# Patient Record
Sex: Female | Born: 1950 | Race: Black or African American | Hispanic: No | Marital: Married | Smoking: Never smoker
Health system: Southern US, Community
[De-identification: ages and names within clinical notes are randomized; demographics above are authoritative.]

## PROBLEM LIST (undated history)

## (undated) DIAGNOSIS — I10 Essential (primary) hypertension: Secondary | ICD-10-CM

## (undated) DIAGNOSIS — E119 Type 2 diabetes mellitus without complications: Secondary | ICD-10-CM

## (undated) DIAGNOSIS — E7800 Pure hypercholesterolemia, unspecified: Secondary | ICD-10-CM

## (undated) HISTORY — PX: ABDOMINAL HYSTERECTOMY: SHX81

## (undated) HISTORY — PX: BREAST SURGERY: SHX581

---

## 2016-10-24 ENCOUNTER — Emergency Department (HOSPITAL_BASED_OUTPATIENT_CLINIC_OR_DEPARTMENT_OTHER): Payer: PRIVATE HEALTH INSURANCE

## 2016-10-24 ENCOUNTER — Emergency Department (HOSPITAL_BASED_OUTPATIENT_CLINIC_OR_DEPARTMENT_OTHER)
Admission: EM | Admit: 2016-10-24 | Discharge: 2016-10-24 | Disposition: A | Discharge status: Home / Self Care | Payer: PRIVATE HEALTH INSURANCE | Attending: Emergency Medicine | Admitting: Emergency Medicine

## 2016-10-24 ENCOUNTER — Encounter (HOSPITAL_BASED_OUTPATIENT_CLINIC_OR_DEPARTMENT_OTHER): Payer: Self-pay | Admitting: *Deleted

## 2016-10-24 DIAGNOSIS — I10 Essential (primary) hypertension: Secondary | ICD-10-CM | POA: Diagnosis not present

## 2016-10-24 DIAGNOSIS — M549 Dorsalgia, unspecified: Secondary | ICD-10-CM | POA: Diagnosis present

## 2016-10-24 DIAGNOSIS — R0781 Pleurodynia: Secondary | ICD-10-CM

## 2016-10-24 DIAGNOSIS — R109 Unspecified abdominal pain: Secondary | ICD-10-CM | POA: Diagnosis not present

## 2016-10-24 DIAGNOSIS — E119 Type 2 diabetes mellitus without complications: Secondary | ICD-10-CM | POA: Diagnosis not present

## 2016-10-24 DIAGNOSIS — N390 Urinary tract infection, site not specified: Secondary | ICD-10-CM | POA: Diagnosis not present

## 2016-10-24 DIAGNOSIS — R0789 Other chest pain: Secondary | ICD-10-CM | POA: Diagnosis not present

## 2016-10-24 DIAGNOSIS — Z79899 Other long term (current) drug therapy: Secondary | ICD-10-CM | POA: Diagnosis not present

## 2016-10-24 DIAGNOSIS — Z7984 Long term (current) use of oral hypoglycemic drugs: Secondary | ICD-10-CM | POA: Insufficient documentation

## 2016-10-24 HISTORY — DX: Essential (primary) hypertension: I10

## 2016-10-24 HISTORY — DX: Type 2 diabetes mellitus without complications: E11.9

## 2016-10-24 HISTORY — DX: Pure hypercholesterolemia, unspecified: E78.00

## 2016-10-24 LAB — CBC WITH DIFFERENTIAL/PLATELET
BASOS ABS: 0 10*3/uL (ref 0.0–0.1)
BASOS PCT: 0 %
Eosinophils Absolute: 0.1 10*3/uL (ref 0.0–0.7)
Eosinophils Relative: 1 %
HEMATOCRIT: 39.9 % (ref 36.0–46.0)
Hemoglobin: 14 g/dL (ref 12.0–15.0)
Lymphocytes Relative: 11 %
Lymphs Abs: 0.8 10*3/uL (ref 0.7–4.0)
MCH: 32.4 pg (ref 26.0–34.0)
MCHC: 35.1 g/dL (ref 30.0–36.0)
MCV: 92.4 fL (ref 78.0–100.0)
MONO ABS: 1.2 10*3/uL — AB (ref 0.1–1.0)
Monocytes Relative: 16 %
NEUTROS ABS: 5.7 10*3/uL (ref 1.7–7.7)
NEUTROS PCT: 72 %
Platelets: 228 10*3/uL (ref 150–400)
RBC: 4.32 MIL/uL (ref 3.87–5.11)
RDW: 12.8 % (ref 11.5–15.5)
WBC: 7.9 10*3/uL (ref 4.0–10.5)

## 2016-10-24 LAB — URINALYSIS, ROUTINE W REFLEX MICROSCOPIC
Bilirubin Urine: NEGATIVE
GLUCOSE, UA: NEGATIVE mg/dL
Hgb urine dipstick: NEGATIVE
Ketones, ur: NEGATIVE mg/dL
Nitrite: NEGATIVE
PH: 6.5 (ref 5.0–8.0)
PROTEIN: NEGATIVE mg/dL
SPECIFIC GRAVITY, URINE: 1.015 (ref 1.005–1.030)

## 2016-10-24 LAB — COMPREHENSIVE METABOLIC PANEL
ALBUMIN: 4.1 g/dL (ref 3.5–5.0)
ALT: 17 U/L (ref 14–54)
AST: 19 U/L (ref 15–41)
Alkaline Phosphatase: 52 U/L (ref 38–126)
Anion gap: 12 (ref 5–15)
BILIRUBIN TOTAL: 1.9 mg/dL — AB (ref 0.3–1.2)
BUN: 10 mg/dL (ref 6–20)
CHLORIDE: 102 mmol/L (ref 101–111)
CO2: 26 mmol/L (ref 22–32)
Calcium: 9 mg/dL (ref 8.9–10.3)
Creatinine, Ser: 0.75 mg/dL (ref 0.44–1.00)
GFR calc Af Amer: >60 mL/min (ref >60)
GFR calc non Af Amer: >60 mL/min (ref >60)
GLUCOSE: 140 mg/dL — AB (ref 65–99)
POTASSIUM: 2.9 mmol/L — AB (ref 3.5–5.1)
Sodium: 140 mmol/L (ref 135–145)
TOTAL PROTEIN: 7.1 g/dL (ref 6.5–8.1)

## 2016-10-24 LAB — URINALYSIS, MICROSCOPIC (REFLEX): RBC / HPF: NONE SEEN RBC/hpf (ref 0–5)

## 2016-10-24 MED ORDER — POTASSIUM CHLORIDE CRYS ER 20 MEQ PO TBCR
40.0000 meq | EXTENDED_RELEASE_TABLET | Freq: Once | ORAL | Status: DC
  Filled 2016-10-24: qty 2

## 2016-10-24 MED ORDER — ACETAMINOPHEN 325 MG PO TABS
650.0000 mg | ORAL_TABLET | Freq: Once | ORAL | Status: DC
  Filled 2016-10-24: qty 2

## 2016-10-24 MED ORDER — ACETAMINOPHEN 325 MG PO TABS: 650.0000 mg | ORAL_TABLET | Freq: Once | ORAL | Status: DC

## 2016-10-24 MED ORDER — ONDANSETRON HCL 4 MG/2ML IJ SOLN
4.0000 mg | Freq: Once | INTRAMUSCULAR | Status: AC
  Administered 2016-10-24: 4 mg via INTRAVENOUS
  Filled 2016-10-24: qty 2

## 2016-10-24 MED ORDER — KETOROLAC TROMETHAMINE 30 MG/ML IJ SOLN: 30.0000 mg | Freq: Once | INTRAMUSCULAR | Status: DC

## 2016-10-24 MED ORDER — SODIUM CHLORIDE 0.9 % IV BOLUS (SEPSIS)
1000.0000 mL | Freq: Once | INTRAVENOUS | Status: AC
  Administered 2016-10-24: 1000 mL via INTRAVENOUS

## 2016-10-24 MED ORDER — CIPROFLOXACIN HCL 500 MG PO TABS: 500.0000 mg | ORAL_TABLET | Freq: Two times a day (BID) | ORAL | 0 refills | Status: AC

## 2016-10-24 MED ORDER — MORPHINE SULFATE (PF) 4 MG/ML IV SOLN: 4.0000 mg | Freq: Once | INTRAVENOUS | Status: DC

## 2016-10-24 MED ORDER — KETOROLAC TROMETHAMINE 30 MG/ML IJ SOLN
15.0000 mg | Freq: Once | INTRAMUSCULAR | Status: AC
  Administered 2016-10-24: 15 mg via INTRAVENOUS
  Filled 2016-10-24: qty 1

## 2016-10-24 NOTE — ED Notes (Signed)
Pt up to bathroom. EMT will obtain vs when pt returns.

## 2016-10-24 NOTE — Discharge Instructions (Signed)
Please read instructions below. Take your antibiotic, Ciprofloxacin, as directed until it is gone. You can continue taking advil as needed for pain. Drink plenty of water. Schedule an appointment with your primary care provider to follow up in 1 week. Return to the ER if you develop a fever, have nausea, back pain, or new or concerning symptoms.

## 2016-10-24 NOTE — ED Notes (Signed)
Pt refused tylenol at this time. States she will wait for blood tests to come back.

## 2016-10-24 NOTE — ED Notes (Signed)
Pt up to bathroom with assistance 

## 2016-10-24 NOTE — ED Notes (Signed)
Pt states she does not want morphine for pain relief.  [place], PA-C advised.

## 2016-10-24 NOTE — ED Triage Notes (Signed)
Pt reports R mid-back since last night. Denies known injury, chest pain, sob. Reports being seen at [facility] this am and had x-ray done (states they didn't tell her the results).

## 2016-10-24 NOTE — ED Provider Notes (Signed)
[facility]   CSN: [id number] Arrival date & time: 10/24/16  1725   By signing my name below, I, Soijett Blue, attest that this documentation has been prepared under the direction and in the presence of [place] Russo, PA-C Electronically Signed: Soijett Blue, ED Scribe. 10/24/16. 6:54 PM.  History   Chief Complaint Chief Complaint  Patient presents with  . Back Pain    HPI Melanie May is a 66 y.o. female with a PMHx of DM, HTN, who presents to the Emergency Department complaining of constant, stabbing, right mid-back/flank pain onset last night. Pt reports associated decreased PO intake, subjective fever, and chills. Pt has tried Advil with moderate relief of her symptoms. Pt right sided mid-back pain is worsened with laying down and bending forward. She states that she was evaluated at Urgent Care this morning and had an xray completed of her back with unknown results. She notes that she was given an injection while in the office and prescribed a muscle relaxer, which was reportedly never filled at the pharmacy. Pt reports that when she went to pick up her prescription, it wasn't there and she was informed to come into the ED for further evaluation. Pt denies abdominal pain, changes in bowel movements, rash, SOB, CP, bowel/bladder incontinence, difficulty urinating, dysuria, urinary frequency, recent rib trauma, and any other symptoms. Denies hx of CA or IV drug use. 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 Denies smoking cigarettes or consuming ETOH.    The history is provided by the patient. No language interpreter was used.    Past Medical History:  Diagnosis Date  . Diabetes mellitus without complication (HCC)   . Hypercholesteremia   . Hypertension     There are no active problems to display for this patient.   Past Surgical History:  Procedure Laterality Date  .  ABDOMINAL HYSTERECTOMY    . BREAST SURGERY      OB History    No data available       Home Medications    Prior to Admission medications   Medication Sig Start Date End Date Taking? Authorizing Provider  amLODipine (NORVASC) 5 MG tablet Take 5 mg by mouth daily.   Yes [provider]  atorvastatin (LIPITOR) 20 MG tablet Take 20 mg by mouth daily.   Yes [provider]  Cholecalciferol (VITAMIN D3) 1000 units CAPS Take by mouth.   Yes [provider]  hydrochlorothiazide (HYDRODIURIL) 25 MG tablet Take 25 mg by mouth daily.   Yes [provider]  Magnesium 250 MG TABS Take 1 tablet by mouth daily.   Yes [provider]  metFORMIN (GLUCOPHAGE) 500 MG tablet Take by mouth 2 (two) times daily with a meal.   Yes [provider]  potassium chloride SA (K-DUR,KLOR-CON) 20 MEQ tablet Take 20 mEq by mouth daily.   Yes [provider]  ciprofloxacin (CIPRO) 500 MG tablet Take 1 tablet (500 mg total) by mouth 2 (two) times daily. 10/24/16 10/31/16  Russo, [place] N, PA-C    Family History No family history on file.  Social History Social History  Substance Use Topics  . Smoking status: Never Smoker  . Smokeless tobacco: Never Used  . Alcohol use No     Allergies   Contrast media [iodinated diagnostic agents]; Iodine; and Sulfa antibiotics   Review of Systems Review of Systems  Constitutional: Positive for  appetite change, chills and fever (subjective).  HENT: Negative for trouble swallowing.   Eyes: Negative for visual disturbance.  Respiratory: Negative for shortness of breath.   Cardiovascular: Negative for chest pain.  Gastrointestinal: Negative for abdominal pain, constipation, diarrhea, nausea and vomiting.       No bowel incontinence.   Endocrine: Positive for polyuria (Chronic secondary to T2DM).  Genitourinary: Negative for difficulty urinating, dysuria and frequency.       No bladder incontinence.     Musculoskeletal: Positive for back pain (right mid).  Skin: Negative for rash.  Allergic/Immunologic: Positive for immunocompromised state (Type 2 DM).  Neurological: Negative for headaches.    Physical Exam Updated Vital Signs BP 132/68 (BP Location: Left Arm)   Pulse 80   Temp 98.9 F (37.2 C) (Oral)   Resp 16   Ht 5' 7.5" (1.715 m)   Wt 208 lb (94.3 kg)   SpO2 96%   BMI 32.10 kg/m   Physical Exam  Constitutional: She appears well-developed and well-nourished.  Patient appears uncomfortable, pacing in the room.  HENT:  Head: Normocephalic and atraumatic.  Mouth/Throat: Oropharynx is clear and moist.  Eyes: Conjunctivae are normal.  Neck: Normal range of motion.  Cardiovascular: Normal rate, regular rhythm, normal heart sounds and intact distal pulses.  Exam reveals no gallop and no friction rub.   No murmur heard. Pulmonary/Chest: Effort normal and breath sounds normal. No respiratory distress. She has no wheezes. She has no rales.  Abdominal: Soft. Normal appearance and bowel sounds are normal. She exhibits no distension and no mass. There is no tenderness. There is CVA tenderness (Right). There is no rigidity, no rebound and negative Murphy's sign. No hernia.  Musculoskeletal:  Right posterior and lateral rib tenderness. No spinal or paraspinal tenderness. Nl ROM to bilateral shoulders.  Neurological: She is alert.  Skin: Skin is warm.  Skin without evidence of a rash or hypersensitivity overlying area of pain.  Psychiatric: She has a normal mood and affect. Her behavior is normal.  Nursing note and vitals reviewed.    ED Treatments / Results  DIAGNOSTIC STUDIES: Oxygen Saturation is 96% on RA, nl by my interpretation.    COORDINATION OF CARE: 6:51 PM Discussed treatment plan with pt at bedside and pt agreed to plan.   Labs (all labs ordered are listed, but only abnormal results are displayed) Labs Reviewed  URINALYSIS, ROUTINE W REFLEX MICROSCOPIC -  Abnormal; Notable for the following:       Result Value   Leukocytes, UA LARGE (*)    All other components within normal limits  URINALYSIS, MICROSCOPIC (REFLEX) - Abnormal; Notable for the following:    Bacteria, UA MANY (*)    Squamous Epithelial / LPF 0-5 (*)    All other components within normal limits  CBC WITH DIFFERENTIAL/PLATELET - Abnormal; Notable for the following:    Monocytes Absolute 1.2 (*)    All other components within normal limits  COMPREHENSIVE METABOLIC PANEL - Abnormal; Notable for the following:    Potassium 2.9 (*)    Glucose, Bld 140 (*)    Total Bilirubin 1.9 (*)    All other components within normal limits  URINE CULTURE    EKG  EKG Interpretation None       Radiology Dg Chest 2 View  Result Date: 10/24/2016 CLINICAL DATA:  Right mid back pain EXAM: CHEST  2 VIEW COMPARISON:  None. FINDINGS: The heart size and mediastinal contours are within normal limits. Both lungs are clear.  Mild degenerative changes of the spine. IMPRESSION: No active cardiopulmonary disease. Electronically Signed   By: Jasmine Pang M.D.   On: 10/24/2016 20:59   Ct Renal Stone Study  Result Date: 10/24/2016 CLINICAL DATA:  Right flank pain for 2 days. EXAM: CT ABDOMEN AND PELVIS WITHOUT CONTRAST TECHNIQUE: Multidetector CT imaging of the abdomen and pelvis was performed following the standard protocol without IV contrast. COMPARISON:  None. FINDINGS: Lower chest: No acute abnormality. Hepatobiliary: Several hepatic cysts are identified. No suspicious masses. The gallbladder is normal in appearance. Pancreas: Unremarkable. No pancreatic ductal dilatation or surrounding inflammatory changes. Spleen: Normal in size without focal abnormality. Adrenals/Urinary Tract: Adrenal glands are unremarkable. Kidneys are normal, without renal calculi, focal lesion, or hydronephrosis. Bladder is unremarkable. Stomach/Bowel: The stomach and small bowel are normal. A few scattered colonic diverticuli  are identified in the right colon. No diverticulitis identified. The appendix is well seen with no appendicitis. Remainder of the colon is unremarkable. Vascular/Lymphatic: Atherosclerosis is seen in the non aneurysmal aorta. No adenopathy. Reproductive: Status post hysterectomy. No adnexal masses. Other: There is ascites in the pelvis of uncertain etiology. No free air. There is a fat containing umbilical hernia. Musculoskeletal: No acute or significant osseous findings. IMPRESSION: 1. There is a small amount of free fluid in the pelvis of uncertain etiology. No cause for acute pain identified. 2. Mild atherosclerosis. Electronically Signed   By: Gerome Sam III M.D   On: 10/24/2016 21:02    Procedures Procedures (including critical care time)  Medications Ordered in ED Medications  potassium chloride SA (K-DUR,KLOR-CON) CR tablet 40 mEq (40 mEq Oral Not Given 10/24/16 2224)  sodium chloride 0.9 % bolus 1,000 mL (0 mLs Intravenous Stopped 10/24/16 2248)  ketorolac (TORADOL) 30 MG/ML injection 15 mg (15 mg Intravenous Given 10/24/16 2114)  ondansetron (ZOFRAN) injection 4 mg (4 mg Intravenous Given 10/24/16 2147)     Initial Impression / Assessment and Plan / ED Course  I have reviewed the triage vital signs and the nursing notes.  Pertinent labs & imaging results that were available during my care of the patient were reviewed by me and considered in my medical decision making (see chart for details).     Patient with acute onset of right posterior rib pain, likely musculoskeletal. Tenderness reproducible on exam. UA done; consistent with UTI. Due to pt's level of discomfort, CT renal stone study done as well as chest x-ray to rule out nephrolithiasis versus cardiopulmonary etiology; Both resulting negative. Patient is hemodynamically stable, vital signs are normal. CBC and CMP unremarkable, with the exception of K 2.9. Patient states she takes by mouth potassium replacement daily, however has  not taken her dose today. Patient offered PO dose in ED, however patient declined. Fluid bolus given. Toradol with moderate relief of pain. Patient discussed with and seen by Dr. Madilyn Hook. Patient is safe for discharge with PCP follow-up. Cipro for UTI. Symptomatic management for pain. Patient has agreeable to plan and safe for discharge home.  Discussed results, findings, treatment and follow up. Patient advised of return precautions. Patient verbalized understanding and agreed with plan.   Final Clinical Impressions(s) / ED Diagnoses   Final diagnoses:  Urinary tract infection without hematuria, site unspecified  Rib pain on right side    New Prescriptions Discharge Medication List as of 10/24/2016  9:26 PM    START taking these medications   Details  ciprofloxacin (CIPRO) 500 MG tablet Take 1 tablet (500 mg total) by mouth 2 (two) times  daily., Starting Sat 10/24/2016, Until Sat 10/31/2016, Print       I personally performed the services described in this documentation, which was scribed in my presence. The recorded information has been reviewed and is accurate.     Russo, [place] N, PA-C 10/25/16 0144    Tilden Fossa, MD 11/03/16 [phone]

## 2016-10-26 LAB — URINE CULTURE

## 2018-01-13 IMAGING — CT CT RENAL STONE PROTOCOL
2 of 4 series · 16 of 46 positions shown, 18 images · non-contrast
Comparison: None.

CLINICAL DATA: Right flank pain for 2 days.

EXAM:
CT ABDOMEN AND PELVIS WITHOUT CONTRAST
TECHNIQUE: Multidetector CT imaging of the abdomen and pelvis was performed
following the standard protocol without IV contrast.

[Series 2: axial st · axial · 0.82mm/px · z∈[-470,-35]mm · 13 of 95 slices shown, 15 images]
[im 4/95  soft-tissue]
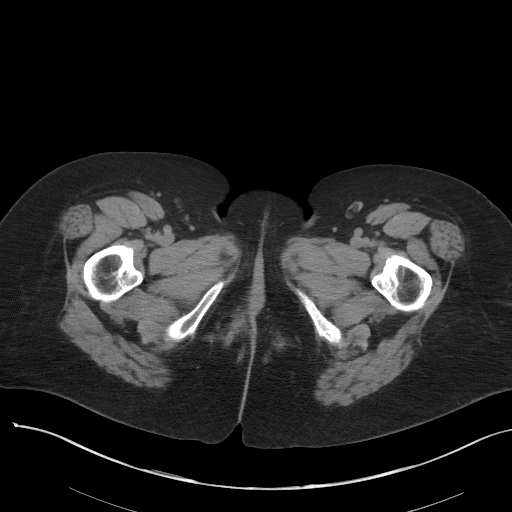
[im 4/95  bone]
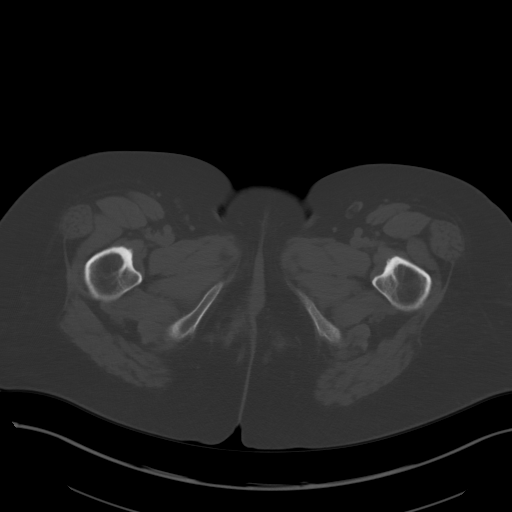
[im 12/95  soft-tissue]
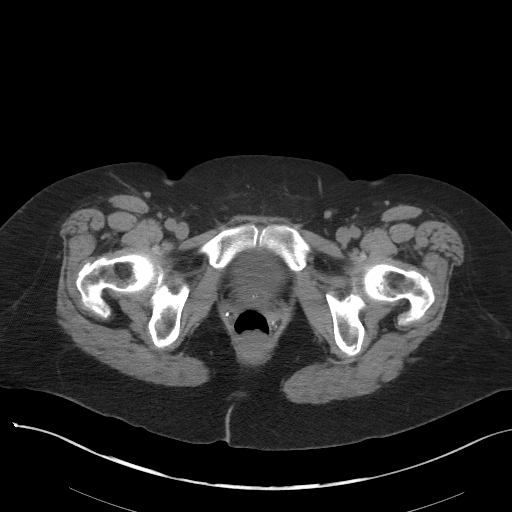
[im 20/95  soft-tissue]
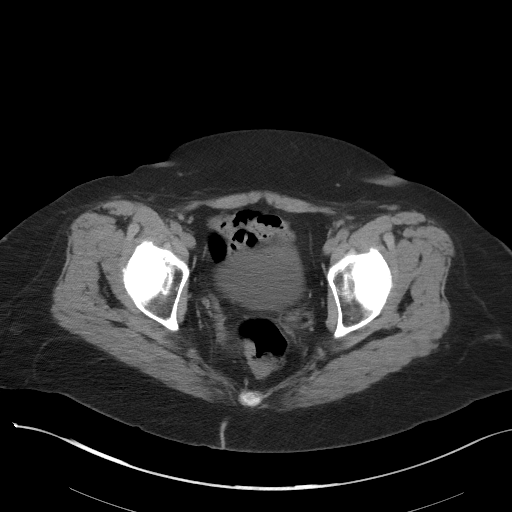
[im 28/95  soft-tissue]
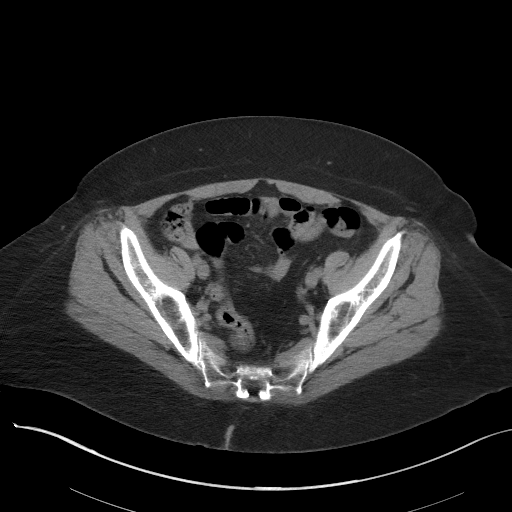
[im 32/95  soft-tissue]
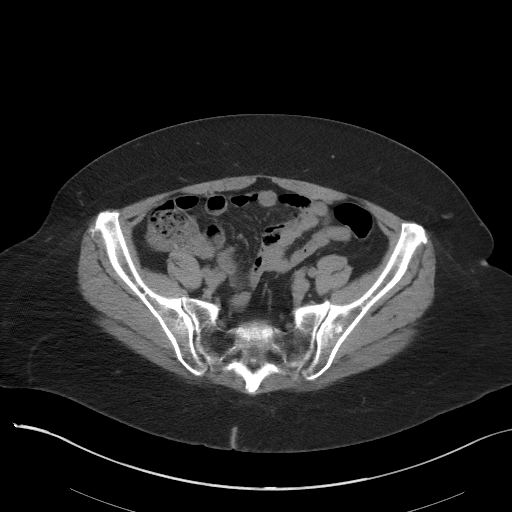
[im 40/95  soft-tissue]
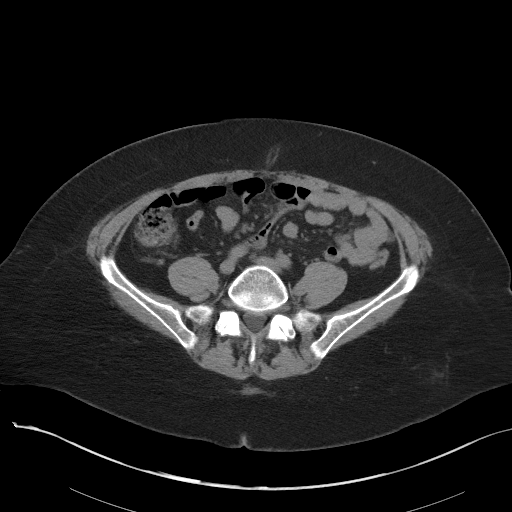
[im 48/95  soft-tissue]
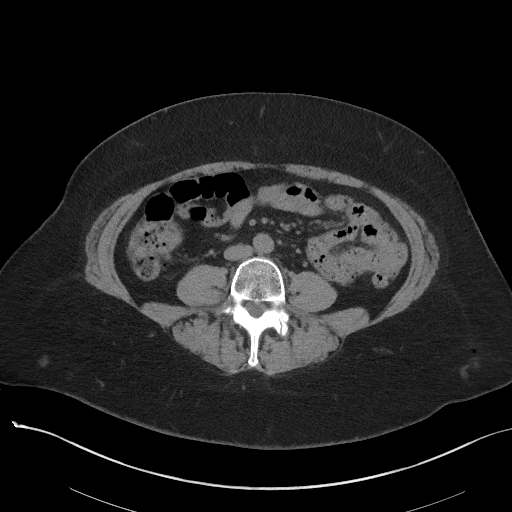
[im 55/95  soft-tissue]
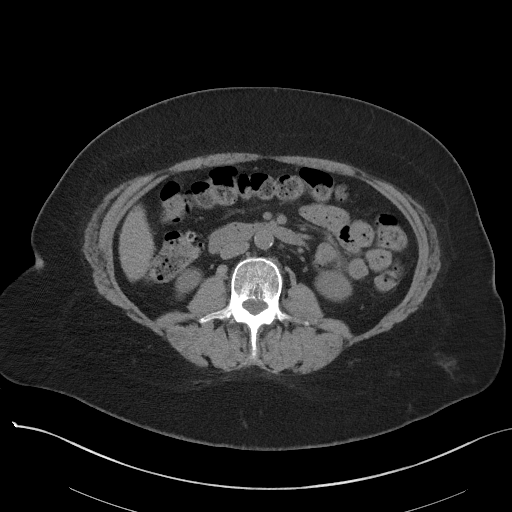
[im 63/95  soft-tissue]
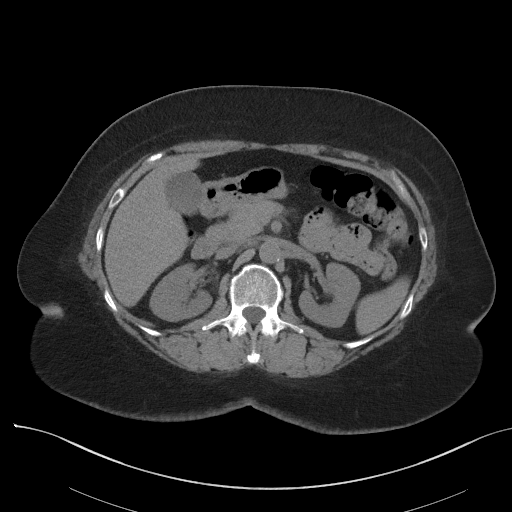
[im 63/95  bone]
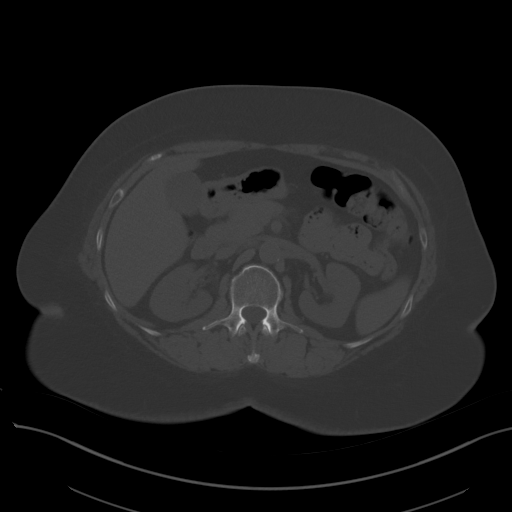
[im 67/95  soft-tissue]
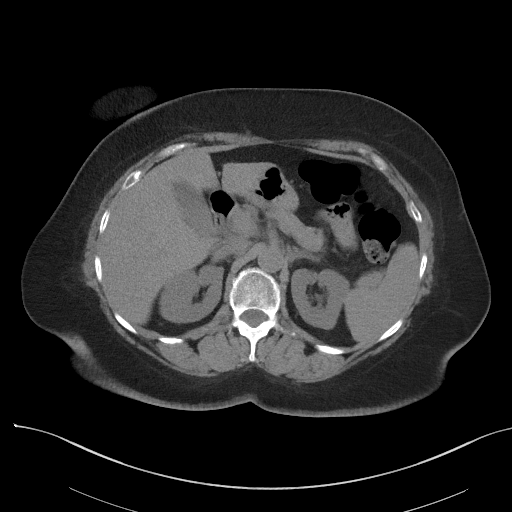
[im 75/95  soft-tissue]
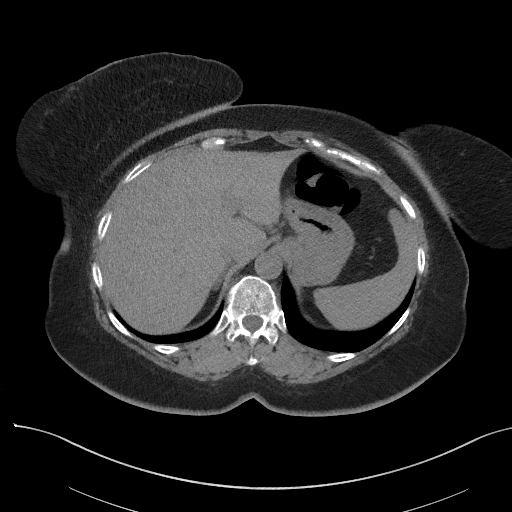
[im 83/95  soft-tissue]
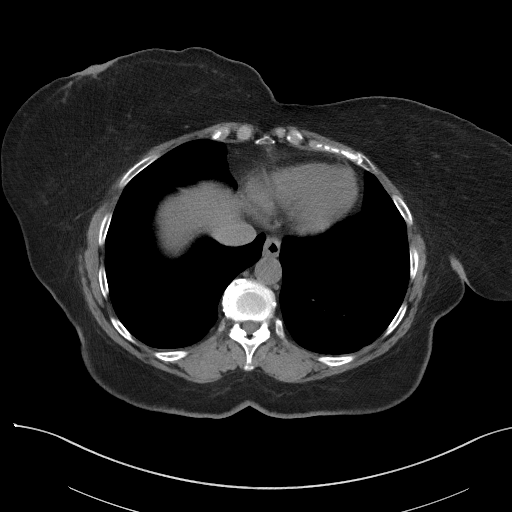
[im 91/95  soft-tissue]
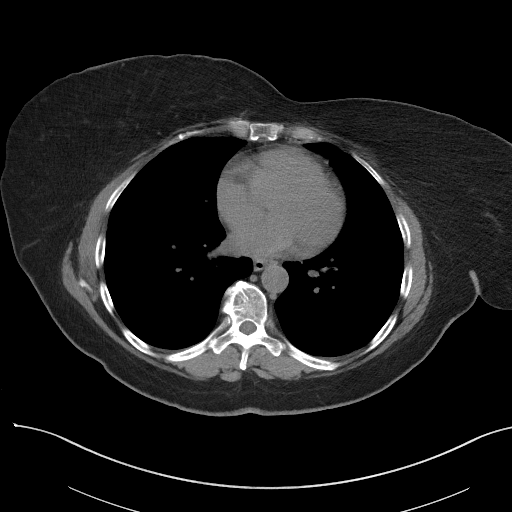

[Series 4: coronal st · coronal · 0.80mm/px · 3 of 65 slices shown]
[im 22/65  soft-tissue]
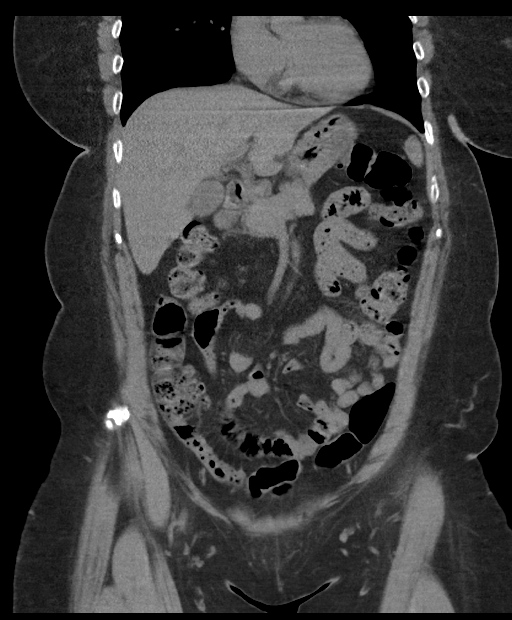
[im 29/65  soft-tissue]
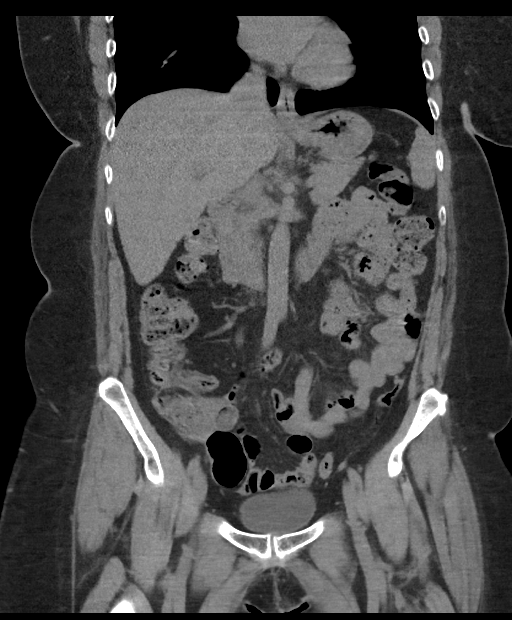
[im 36/65  soft-tissue]
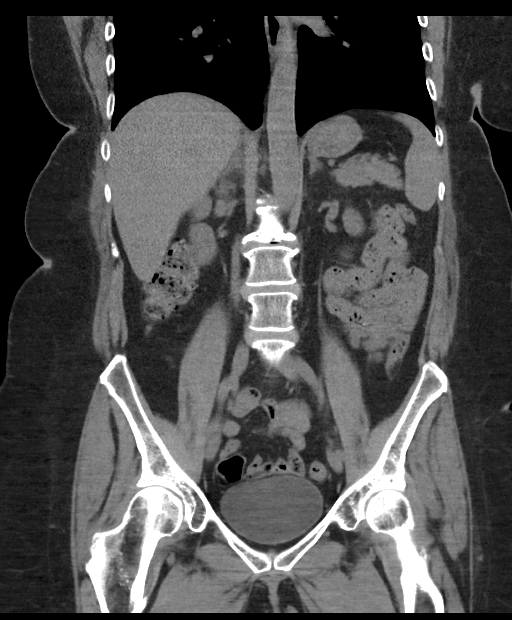

[16 of 46 positions shown; findings below may reference images not displayed]

FINDINGS: Lower chest: No acute abnormality.

Hepatobiliary: Several hepatic cysts are identified. No suspicious
masses. The gallbladder is normal in appearance.

Pancreas: Unremarkable. No pancreatic ductal dilatation or
surrounding inflammatory changes.

Spleen: Normal in size without focal abnormality.

Adrenals/Urinary Tract: Adrenal glands are unremarkable. Kidneys are
normal, without renal calculi, focal lesion, or hydronephrosis.
Bladder is unremarkable.

Stomach/Bowel: The stomach and small bowel are normal. A few
scattered colonic diverticuli are identified in the right colon. No
diverticulitis identified. The appendix is well seen with no
appendicitis. Remainder of the colon is unremarkable.

Vascular/Lymphatic: Atherosclerosis is seen in the non aneurysmal
aorta. No adenopathy.

Reproductive: Status post hysterectomy. No adnexal masses.

Other: There is ascites in the pelvis of uncertain etiology. No free
air. There is a fat containing umbilical hernia.

Musculoskeletal: No acute or significant osseous findings.
IMPRESSION: 1. There is a small amount of free fluid in the pelvis of uncertain
etiology. No cause for acute pain identified.
2. Mild atherosclerosis.
